# Patient Record
Sex: Female | Born: 1945 | Marital: Married | Smoking: Never smoker
Health system: Southern US, Community
[De-identification: ages and names within clinical notes are randomized; demographics above are authoritative.]

## PROBLEM LIST (undated history)

## (undated) DIAGNOSIS — R011 Cardiac murmur, unspecified: Secondary | ICD-10-CM

## (undated) DIAGNOSIS — E782 Mixed hyperlipidemia: Secondary | ICD-10-CM

## (undated) DIAGNOSIS — M1990 Unspecified osteoarthritis, unspecified site: Secondary | ICD-10-CM

## (undated) DIAGNOSIS — F32A Depression, unspecified: Secondary | ICD-10-CM

## (undated) HISTORY — PX: TONSILLECTOMY: SUR1361

## (undated) HISTORY — PX: COLONOSCOPY: SHX174

## (undated) HISTORY — PX: CATARACT EXTRACTION: SUR2

---

## 2018-08-21 ENCOUNTER — Other Ambulatory Visit: Payer: Self-pay | Admitting: Neurology

## 2018-08-21 DIAGNOSIS — R413 Other amnesia: Secondary | ICD-10-CM

## 2018-08-30 ENCOUNTER — Ambulatory Visit
Admission: RE | Admit: 2018-08-30 | Discharge: 2018-08-30 | Disposition: A | Discharge status: Home / Self Care | Payer: Medicare Other | Source: Ambulatory Visit | Attending: Neurology | Admitting: Neurology

## 2018-08-30 ENCOUNTER — Other Ambulatory Visit: Payer: Self-pay

## 2018-08-30 DIAGNOSIS — R413 Other amnesia: Secondary | ICD-10-CM

## 2020-05-19 ENCOUNTER — Other Ambulatory Visit: Payer: Self-pay | Admitting: Internal Medicine

## 2020-05-19 DIAGNOSIS — Z1231 Encounter for screening mammogram for malignant neoplasm of breast: Secondary | ICD-10-CM

## 2020-05-24 NOTE — Progress Notes (Incomplete)
[facility]  [address] [place], [place] [address] Phone: [phone]  Fax: [phone]   Clinic Day:  05/24/2020  Referring physician: Adin Hector, MD  Chief Complaint: Norma Lambert is a 75 y.o. 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 Ramonita Lab for assessment and management.   HPI: ***  CBC followed: 05/14/2018: Hematocrit 39.3, hemoglobin 12.7, platelets 218,000, WBC 7,500 (ANC 2580; ALC 3,910). 05/05/2019: Hematocrit 41.7, hemoglobin 13.7, platelets 216,000, WBC 8,100 ([place] 2630; ALC 4,490). 12/07/2019: Hematocrit 39.7, hemoglobin 13.0, platelets 213,000, WBC 8,400 (ANC 2510; ALC 4,600). 05/12/2020: Hematocrit 40.4, hemoglobin 13.3, platelets 244,000, WBC 8,300 ([place] 3120; ALC 4,150).  Flow cytometry on 05/12/2020 revealed involvement by a CD5 (very dim), CD10- clonal B-cell population, with non specific phenotype, representing <5,000/ul.  Cells were CD103-, CD23-, FMC7+ with kappa light chain restriction.  A typical phenotype for CLL/SLL, mantle cell lymphoma, hairy cell leukemia or follicular center cell lymphoma was not detected.  Symptomatically, ***  No past medical history on file.  *** The histories are not reviewed yet. Please review them in the "History" navigator section and refresh this [place].  No family history on file.  Social History:  has no history on file for tobacco use, alcohol use, and drug use.The patient is ***alone/accompanied by *** today.  Allergies: Not on File  Current Medications: No current outpatient medications on file.   No current facility-administered medications for this visit.    Review of Systems  Constitutional: Negative for chills, diaphoresis, fever, malaise/fatigue and weight loss.  HENT: Negative for congestion, ear discharge, ear pain, hearing loss, nosebleeds, sinus pain, sore throat and tinnitus.   Eyes: Negative for blurred vision.  Respiratory:  Negative for cough, hemoptysis, sputum production and shortness of breath.   Cardiovascular: Negative for chest pain, palpitations and leg swelling.  Gastrointestinal: Negative for abdominal pain, blood in stool, constipation, diarrhea, heartburn, melena, nausea and vomiting.  Genitourinary: Negative for dysuria, frequency, hematuria and urgency.  Musculoskeletal: Negative for back pain, joint pain, myalgias and neck pain.  Skin: Negative for itching and rash.  Neurological: Negative for dizziness, tingling, sensory change, weakness and headaches.  Endo/Heme/Allergies: Does not bruise/bleed easily.  Psychiatric/Behavioral: Negative for depression and memory loss. The patient is not nervous/anxious and does not have insomnia.   All other systems reviewed and are negative.    Performance status (ECOG): ***  Vitals There were no vitals taken for this visit.   Physical Exam Vitals and nursing note reviewed.  Constitutional:      General: She is not in acute distress.    Appearance: She is not diaphoretic.  HENT:     Head: Normocephalic and atraumatic.     Mouth/Throat:     Mouth: Mucous membranes are moist.     Pharynx: Oropharynx is clear.  Eyes:     General: No scleral icterus.    Extraocular Movements: Extraocular movements intact.     Conjunctiva/sclera: Conjunctivae normal.     Pupils: Pupils are equal, round, and reactive to light.  Cardiovascular:     Rate and Rhythm: Normal rate and regular rhythm.     Heart sounds: Normal heart sounds. No murmur heard.   Pulmonary:     Effort: Pulmonary effort is normal. No respiratory distress.     Breath sounds: Normal breath sounds. No wheezing or rales.  Chest:     Chest wall: No tenderness.  Breasts:     Right: No axillary adenopathy or supraclavicular  adenopathy.     Left: No axillary adenopathy or supraclavicular adenopathy.    Abdominal:     General: Bowel sounds are normal. There is no distension.     Palpations:  Abdomen is soft. There is no mass.     Tenderness: There is no abdominal tenderness. There is no guarding or rebound.  Musculoskeletal:        General: No swelling or tenderness. Normal range of motion.     Cervical back: Normal range of motion and neck supple.  Lymphadenopathy:     Head:     Right side of head: No preauricular, posterior auricular or occipital adenopathy.     Left side of head: No preauricular, posterior auricular or occipital adenopathy.     Cervical: No cervical adenopathy.     Upper Body:     Right upper body: No supraclavicular or axillary adenopathy.     Left upper body: No supraclavicular or axillary adenopathy.     Lower Body: No right inguinal adenopathy. No left inguinal adenopathy.  Skin:    General: Skin is warm and dry.  Neurological:     Mental Status: She is alert and oriented to person, place, and time.  Psychiatric:        Behavior: Behavior normal.        Thought Content: Thought content normal.        Judgment: Judgment normal.      No visits with results within 3 Day(s) from this visit.  Latest known visit with results is:  No results found for any previous visit.    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  Flow cytometry on 05/12/2020 revealed involvement by a CD5 (very dim), CD10- clonal B-cell population, with non specific phenotype, representing <5,000/ul.  Cells were CD103-, CD23-, FMC7+ with kappa light chain restriction.  A typical phenotype for CLL/SLL, mantle cell lymphoma, hairy cell leukemia or follicular center cell lymphoma was not detected.  CBC on 05/12/2020 revealed a hematocrit of 40.4, hemoglobin 13.3, platelets 244,000, WBC 8,300 ([place] 3120; ALC 4,150).  Symptomatically, ***  Plan: 1.   Labs today: 2.   Peripheral smear for path review. 3.   Lymphocytosis    I discussed the assessment and treatment plan with the patient.  The patient was provided an opportunity to  ask questions and all were answered.  The patient agreed with the plan and demonstrated an understanding of the instructions.  The patient was advised to call back if the symptoms worsen or if the condition fails to improve as anticipated.  I provided *** minutes of face-to-face time during this this encounter and > 50% was spent counseling as documented under my assessment and plan.   Melissa C. Mike Gip, MD, PhD    05/24/2020, 10:02 AM  I, Mirian Mo Tufford, am acting as Education administrator for [employer]. Mike Gip, MD, PhD.  {Add scribe attestation statement}

## 2020-05-25 ENCOUNTER — Inpatient Hospital Stay: Payer: Medicare PPO | Attending: Hematology and Oncology | Admitting: Hematology and Oncology

## 2020-05-25 ENCOUNTER — Encounter: Payer: Self-pay | Admitting: Hematology and Oncology

## 2020-05-25 ENCOUNTER — Inpatient Hospital Stay: Payer: Medicare PPO

## 2020-05-25 ENCOUNTER — Other Ambulatory Visit: Payer: Self-pay

## 2020-05-25 VITALS — BP 116/70 | HR 59 | Temp 97.1°F | Resp 20 | Ht 62.0 in | Wt 148.8 lb

## 2020-05-25 DIAGNOSIS — Z79899 Other long term (current) drug therapy: Secondary | ICD-10-CM | POA: Diagnosis not present

## 2020-05-25 DIAGNOSIS — D72820 Lymphocytosis (symptomatic): Secondary | ICD-10-CM

## 2020-05-25 DIAGNOSIS — Z800 Family history of malignant neoplasm of digestive organs: Secondary | ICD-10-CM | POA: Diagnosis not present

## 2020-05-25 LAB — CBC WITH DIFFERENTIAL/PLATELET
Abs Immature Granulocytes: 0.04 10*3/uL (ref 0.00–0.07)
Basophils Absolute: 0.1 10*3/uL (ref 0.0–0.1)
Basophils Relative: 1 %
Eosinophils Absolute: 0.1 10*3/uL (ref 0.0–0.5)
Eosinophils Relative: 1 %
HCT: 39.8 % (ref 36.0–46.0)
Hemoglobin: 13.1 g/dL (ref 12.0–15.0)
Immature Granulocytes: 1 %
Lymphocytes Relative: 34 %
Lymphs Abs: 3 10*3/uL (ref 0.7–4.0)
MCH: 30.2 pg (ref 26.0–34.0)
MCHC: 32.9 g/dL (ref 30.0–36.0)
MCV: 91.7 fL (ref 80.0–100.0)
Monocytes Absolute: 0.5 10*3/uL (ref 0.1–1.0)
Monocytes Relative: 6 %
Neutro Abs: 5 10*3/uL (ref 1.7–7.7)
Neutrophils Relative %: 57 %
Platelets: 241 10*3/uL (ref 150–400)
RBC: 4.34 MIL/uL (ref 3.87–5.11)
RDW: 12.5 % (ref 11.5–15.5)
WBC: 8.8 10*3/uL (ref 4.0–10.5)
nRBC: 0 % (ref 0.0–0.2)

## 2020-05-25 LAB — PATHOLOGIST SMEAR REVIEW

## 2020-05-25 LAB — URIC ACID: Uric Acid, Serum: 5 mg/dL (ref 2.5–7.1)

## 2020-05-25 LAB — LACTATE DEHYDROGENASE: LDH: 130 U/L (ref 98–192)

## 2020-05-25 NOTE — Progress Notes (Signed)
Patient here today for initial evaluation regarding lymphcytosis, referred by Dr. Caryl Comes.

## 2020-05-28 LAB — MULTIPLE MYELOMA PANEL, SERUM
Albumin SerPl Elph-Mcnc: 4.3 g/dL (ref 2.9–4.4)
Albumin/Glob SerPl: 1.6 (ref 0.7–1.7)
Alpha 1: 0.2 g/dL (ref 0.0–0.4)
Alpha2 Glob SerPl Elph-Mcnc: 0.7 g/dL (ref 0.4–1.0)
B-Globulin SerPl Elph-Mcnc: 0.9 g/dL (ref 0.7–1.3)
Gamma Glob SerPl Elph-Mcnc: 0.8 g/dL (ref 0.4–1.8)
Globulin, Total: 2.7 g/dL (ref 2.2–3.9)
IgA: 188 mg/dL (ref 64–422)
IgG (Immunoglobin G), Serum: 1000 mg/dL (ref 586–1602)
IgM (Immunoglobulin M), Srm: 55 mg/dL (ref 26–217)
Total Protein ELP: 7 g/dL (ref 6.0–8.5)

## 2020-05-30 ENCOUNTER — Ambulatory Visit
Admission: RE | Admit: 2020-05-30 | Discharge: 2020-05-30 | Disposition: A | Discharge status: Home / Self Care | Payer: Medicare PPO | Source: Ambulatory Visit | Attending: Internal Medicine | Admitting: Internal Medicine

## 2020-05-30 ENCOUNTER — Other Ambulatory Visit: Payer: Self-pay

## 2020-05-30 DIAGNOSIS — Z1231 Encounter for screening mammogram for malignant neoplasm of breast: Secondary | ICD-10-CM | POA: Diagnosis not present

## 2020-05-31 ENCOUNTER — Inpatient Hospital Stay
Admission: RE | Admit: 2020-05-31 | Discharge: 2020-05-31 | Disposition: A | Discharge status: Home / Self Care | Payer: Self-pay | Source: Ambulatory Visit | Attending: *Deleted | Admitting: *Deleted

## 2020-05-31 ENCOUNTER — Other Ambulatory Visit: Payer: Self-pay | Admitting: *Deleted

## 2020-05-31 DIAGNOSIS — Z1231 Encounter for screening mammogram for malignant neoplasm of breast: Secondary | ICD-10-CM

## 2020-06-09 ENCOUNTER — Telehealth: Payer: Self-pay | Admitting: *Deleted

## 2020-06-09 NOTE — Telephone Encounter (Signed)
Attempted to reach patient prior to her virtual visit tomorrow with Dr. Mike Gip. Left vm for patient to return my phone call.

## 2020-06-09 NOTE — Progress Notes (Signed)
This encounter was created in error - please disregard.

## 2020-06-10 ENCOUNTER — Inpatient Hospital Stay: Payer: Medicare PPO | Attending: Hematology and Oncology | Admitting: Hematology and Oncology

## 2020-06-10 ENCOUNTER — Telehealth: Payer: Self-pay | Admitting: *Deleted

## 2020-06-10 ENCOUNTER — Encounter: Payer: Self-pay | Admitting: *Deleted

## 2020-06-10 DIAGNOSIS — D72820 Lymphocytosis (symptomatic): Secondary | ICD-10-CM

## 2020-06-10 NOTE — Telephone Encounter (Signed)
Left vm for pt to return my phone call to get her ready for a virtual visit with Dr. Mike Gip today.

## 2020-06-19 NOTE — Progress Notes (Signed)
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 Ramonita Lab for assessment and management.   Referring physician: Adin Hector, MD  HPI: The patient became aware of her lymphocytosis 2 weeks ago at a routine check-up. She was not having any symptoms at this time.  CBC followed: 05/14/2018: Hematocrit 39.3, hemoglobin 12.7, platelets 218,000, WBC 7,500 (ANC 2580; ALC 3,910). 05/05/2019: Hematocrit 41.7, hemoglobin 13.7, platelets 216,000, WBC 8,100 ([place] 2630; ALC 4,490). 12/07/2019: Hematocrit 39.7, hemoglobin 13.0, platelets 213,000, WBC 8,400 (ANC 2510; ALC 4,600). 05/12/2020: Hematocrit 40.4, hemoglobin 13.3, platelets 244,000, WBC 8,300 ([place] 3120; ALC 4,150).  Flow cytometry on 05/12/2020 revealed involvement by a CD5 (very dim), CD10- clonal B-cell population, with non specific phenotype, representing <5,000/ul.  Cells were CD103-, CD23-, FMC7+ with kappa light chain restriction.  A typical phenotype for CLL/SLL, mantle cell lymphoma, hairy cell leukemia or follicular center cell lymphoma was not detected.  Symptomatically, she has felt good. She has occasional reflux. She has left shoulder pain and got an injection 2 days ago. The patient denies fevers, sweats, weight loss, headaches, changes in vision, runny nose, sore throat, cough, shortness of breath, chest pain, palpitations, nausea, vomiting, diarrhea, early satiety, skin changes, numbness, weakness, balance or coordination problems, lumps or bumps, and bleeding of any kind.  The patient has UTIs 2-3x per year. She does not have urinary symptoms right now. She denies any other infections. The patient has always been a light eater. She stays active by walking 10,000 steps per day.  Her mother died from  colon cancer. Her brother has amyloidosis. Her paternal uncle died of cancer 74 years ago.  History reviewed. No pertinent past medical history.  History reviewed. No pertinent surgical history.  Family History  Problem Relation Age of Onset  . Breast cancer Neg Hx     Social History:  reports current alcohol use. She reports that she does not use drugs. No history on file for tobacco use.  She has a glass of wine at night. She denies tobacco use. She denies exposure to radiation or toxins. She is a retired Pharmacist, hospital. She is originally from [place] and still has a house and family there. She visits [place] in the summers. Her husband is Clair Gulling. The patient is accompanied by Clair Gulling today.  Allergies: No Known Allergies  Current Medications: Current Outpatient Medications  Medication Sig Dispense Refill  . acetaminophen (TYLENOL) 500 MG tablet Take by mouth.    Marland Kitchen atorvastatin (LIPITOR) 10 MG tablet TAKE 1 TABLET(10 MG) BY MOUTH EVERY DAY    . calcium elemental as carbonate (BARIATRIC TUMS ULTRA) 400 MG chewable tablet Chew by mouth.    . citalopram (CELEXA) 20 MG tablet TAKE 1 TABLET(20 MG) BY MOUTH EVERY DAY    . cyanocobalamin 1000 MCG tablet Take by mouth.    . diclofenac Sodium (VOLTAREN) 1 % GEL Apply topically.    . magnesium oxide (MAG-OX) 400 MG tablet Take by mouth.    . naproxen sodium (ALEVE) 220 MG tablet Take by mouth.     No current facility-administered medications for this visit.    Review of Systems:  Constitutional: Negative for chills, diaphoresis, fever, malaise/fatigue and weight loss.  HENT: Negative for congestion, ear discharge, ear pain, hearing loss, nosebleeds, sinus pain, sore throat  and tinnitus.   Eyes: Negative for blurred vision.  Respiratory: Negative for cough, hemoptysis, sputum production and shortness of breath.   Cardiovascular: Negative for chest pain, palpitations and leg swelling.  Gastrointestinal: Positive for heartburn. Negative for abdominal pain,  blood in stool, constipation, diarrhea, melena, nausea and vomiting.  Genitourinary: Negative for dysuria, frequency, hematuria and urgency. History of 2-3 UTIs per year. Musculoskeletal: Positive for joint pain (left shoulder). Negative for back pain, myalgias and neck pain.  Skin: Negative for itching and rash.  Neurological: Negative for dizziness, tingling, sensory change, weakness and headaches.  Endo/Heme/Allergies: Does not bruise/bleed easily.  Psychiatric/Behavioral: Negative for depression and memory loss. The patient is not nervous/anxious and does not have insomnia.   All other systems reviewed and are negative.  Performance status:  0  Physical Exam: Blood pressure 116/70, pulse (!) 59, temperature (!) 97.1 F (36.2 C), temperature source Tympanic, resp. rate 20, height 5\' 2"  (1.575 m), weight 148 lb 13 oz (67.5 kg). GENERAL:  Well developed, well nourished, woman sitting comfortably in the exam room in no acute distress. MENTAL STATUS:  Alert and oriented to person, place and time. HEAD:  Styled gray hair.  Normocephalic, atraumatic, face symmetric, no Cushingoid features. EYES:  Brown eyes.  Pupils equal round and reactive to light and accomodation.  No conjunctivitis or scleral icterus. ENT:  Oropharynx clear without lesion.  Tongue normal. Mucous membranes moist.  RESPIRATORY:  Clear to auscultation without rales, wheezes or rhonchi. CARDIOVASCULAR:  Regular rate and rhythm without murmur, rub or gallop. ABDOMEN:  Soft, slight LLQ tenderness without guarding or rebound tenderness.  Active bowel sounds and no hepatosplenomegaly.  No masses. SKIN:  No rashes, ulcers or lesions. EXTREMITIES: No edema, no skin discoloration or tenderness.  No palpable cords. LYMPH NODES: No palpable cervical, supraclavicular, axillary or inguinal adenopathy  NEUROLOGICAL: Unremarkable. PSYCH:  Appropriate.   Appointment on 05/25/2020  Component Date Value Ref Range Status  . IgG  (Immunoglobin G), Serum 05/25/2020 1,000  586 - 1,602 mg/dL Final  . IgA 05/25/2020 188  64 - 422 mg/dL Final  . IgM (Immunoglobulin M), Srm 05/25/2020 55  26 - 217 mg/dL Final  . Total Protein ELP 05/25/2020 7.0  6.0 - 8.5 g/dL Corrected  . Albumin SerPl Elph-Mcnc 05/25/2020 4.3  2.9 - 4.4 g/dL Corrected  . Alpha 1 05/25/2020 0.2  0.0 - 0.4 g/dL Corrected  . Alpha2 Glob SerPl Elph-Mcnc 05/25/2020 0.7  0.4 - 1.0 g/dL Corrected  . B-Globulin SerPl Elph-Mcnc 05/25/2020 0.9  0.7 - 1.3 g/dL Corrected  . Gamma Glob SerPl Elph-Mcnc 05/25/2020 0.8  0.4 - 1.8 g/dL Corrected  . M Protein SerPl Elph-Mcnc 05/25/2020 Not Observed  Not Observed g/dL Corrected  . Globulin, Total 05/25/2020 2.7  2.2 - 3.9 g/dL Corrected  . Albumin/Glob SerPl 05/25/2020 1.6  0.7 - 1.7 Corrected  . IFE 1 05/25/2020 Comment   Corrected   Comment: (NOTE) The immunofixation pattern appears unremarkable. Evidence of monoclonal protein is not apparent.   . Please Note 05/25/2020 Comment   Corrected   Comment: (NOTE) Protein electrophoresis scan will follow via computer, mail, or courier delivery. Performed At: [facility] [place], [place] [id number] Rush Farmer MD DP:[id number]   . LDH 05/25/2020 130  98 - 192 U/L Final   Performed at [facility], [address], [place], [place] [address]  . WBC 05/25/2020 8.8  4.0 - 10.5 K/uL Final  . RBC 05/25/2020 4.34  3.87 -  5.11 MIL/uL Final  . Hemoglobin 05/25/2020 13.1  12.0 - 15.0 g/dL Final  . HCT 05/25/2020 39.8  36.0 - 46.0 % Final  . MCV 05/25/2020 91.7  80.0 - 100.0 fL Final  . MCH 05/25/2020 30.2  26.0 - 34.0 pg Final  . MCHC 05/25/2020 32.9  30.0 - 36.0 g/dL Final  . RDW 05/25/2020 12.5  11.5 - 15.5 % Final  . Platelets 05/25/2020 241  150 - 400 K/uL Final  . nRBC 05/25/2020 0.0  0.0 - 0.2 % Final  . Neutrophils Relative % 05/25/2020 57  % Final  . Neutro Abs 05/25/2020 5.0  1.7 - 7.7 K/uL Final  . Lymphocytes Relative  05/25/2020 34  % Final  . Lymphs Abs 05/25/2020 3.0  0.7 - 4.0 K/uL Final  . Monocytes Relative 05/25/2020 6  % Final  . Monocytes Absolute 05/25/2020 0.5  0.1 - 1.0 K/uL Final  . Eosinophils Relative 05/25/2020 1  % Final  . Eosinophils Absolute 05/25/2020 0.1  0.0 - 0.5 K/uL Final  . Basophils Relative 05/25/2020 1  % Final  . Basophils Absolute 05/25/2020 0.1  0.0 - 0.1 K/uL Final  . Immature Granulocytes 05/25/2020 1  % Final  . Abs Immature Granulocytes 05/25/2020 0.04  0.00 - 0.07 K/uL Final   Performed at [facility], [address], [place], [place] [address]  . Uric Acid, Serum 05/25/2020 5.0  2.5 - 7.1 mg/dL Final   Performed at [facility], [address], [place], [place] [address]  . Path Review 05/25/2020 Blood smear is reviewed.   Final   Comment: Patient undergoing hematology evaluation for lymphocytosis. Recent peripheral blood flow cytometry revealed presence of a very dim CD5 and CD10 negative clonal B cell population with kappa light chain restriction.  The population had a non specific phenotype and represented less than 5000/ul. Subset of lymphocytes with mild increase in cytoplasm. Unremarkable RBC, WBC, and platelet morphology. Additional laboratory studies are pending as per hematologist. Reviewed by Dellia Nims. Reuel Derby, M.D. Performed at [facility], [address], [place],  [address]     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  Flow cytometry on 05/12/2020 revealed involvement by a CD5 (very dim), CD10- clonal B-cell population, with non specific phenotype, representing <5,000/ul.  Cells were CD103-, CD23-, FMC7+ with kappa light chain restriction.  A typical phenotype for CLL/SLL, mantle cell lymphoma, hairy cell leukemia or follicular center cell lymphoma was not detected.  CBC on 05/12/2020 revealed a hematocrit of 40.4, hemoglobin 13.3, platelets 244,000, WBC  8,300 ([place] 3120; ALC 4,150).  Symptomatically, she feels good.  She denies any B symptoms.  Exam reveals no adenopathy or hepatosplenomegaly.  Plan: 1.   Labs today:  CBC with diff, myeloma panel, LDH, uric acid. 2.   Peripheral smear for path review. 3.   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    Diagnosis and management of each discussed.   Discuss risk of transformation of monoclonal B cell lymphocytosis to CLL.   Discuss indications for treatment of CLL.  Discuss additional labs today. 4.   RN:  Assist patient in setting up [place]. 5.   RTC for MD assessment (telephone) and review of results. 6.   RTC in 1 year for MD assessment and labs (CBC with diff, BMP, LDH).  I discussed the assessment and treatment plan with the patient.  The patient was provided an opportunity to ask questions and all were answered.  The patient agreed with the plan and demonstrated an understanding of the instructions.  The patient was advised to call back if the symptoms worsen or if the condition fails to improve as anticipated.  I provided 26 minutes of face-to-face time during this this encounter and > 50% was spent counseling as documented under my assessment and plan. An additional 10 minutes were spent reviewing her chart (Epic and Care Everywhere) including notes, labs, and imaging studies.    Donovyn Guidice C. Mike Gip, MD, PhD    05/25/2020, 11:01 AM  I, Mirian Mo Tufford, am acting as Education administrator for [employer]. Mike Gip, MD, PhD.  I, Jermine Bibbee C. Mike Gip, MD, have reviewed the above documentation for accuracy and completeness, and I agree with the above.

## 2020-07-28 ENCOUNTER — Encounter: Payer: Self-pay | Admitting: Hematology and Oncology

## 2020-08-11 ENCOUNTER — Inpatient Hospital Stay: Payer: Medicare PPO | Attending: Oncology | Admitting: Oncology

## 2020-08-11 ENCOUNTER — Telehealth: Payer: Self-pay

## 2020-08-11 ENCOUNTER — Other Ambulatory Visit: Payer: Self-pay

## 2020-08-11 NOTE — Telephone Encounter (Signed)
Tried contacting pt in regards to virtual appt with Dr. Tasia Catchings. The only phone number on file is not in service.

## 2021-04-26 ENCOUNTER — Encounter: Payer: Self-pay | Admitting: Gastroenterology

## 2021-04-26 NOTE — H&P (Signed)
Pre-Procedure H&P   Patient ID: Norma Lambert is a 76 y.o. female.  Gastroenterology Provider: Annamaria Helling, DO  Referring Provider: Laurine Blazer, PA PCP: Adin Hector, MD  Date: 04/27/2021  HPI Ms. Norma Lambert is a 76 y.o. female who presents today for Colonoscopy for Surveillance-family history of colon cancer-mother 70s.  Patient with regular bowel movements without hematochezia melena diarrhea or constipation.  She underwent a colonoscopy at [facility] approximately 9 years ago.  These records are not available to [place] at this time.  Unknown findings, but patient denies polyps.  She has monoclonal B-cell CLL per records. Most recent labs WBC 7.4 hemoglobin 13.2 MCV 93.5 platelets 212,000. No other acute GI complaints   Past Medical History:  Diagnosis Date   Arthritis    Depression    Heart murmur    Mixed hyperlipidemia     Past Surgical History:  Procedure Laterality Date   CATARACT EXTRACTION Left    COLONOSCOPY     multiple d/t family hx colon cancer   TONSILLECTOMY      Family History colon cancer-mother 70s No other h/o GI disease or malignancy  Review of Systems  Constitutional:  Negative for activity change, appetite change, chills, diaphoresis, fatigue, fever and unexpected weight change.  HENT:  Negative for trouble swallowing and voice change.   Respiratory:  Negative for shortness of breath and wheezing.   Cardiovascular:  Negative for chest pain, palpitations and leg swelling.  Gastrointestinal:  Negative for abdominal distention, abdominal pain, anal bleeding, blood in stool, constipation, diarrhea, nausea, rectal pain and vomiting.  Musculoskeletal:  Negative for arthralgias and myalgias.  Skin:  Negative for color change and pallor.  Neurological:  Negative for dizziness, syncope and weakness.  Psychiatric/Behavioral:  Negative for confusion.   All other systems reviewed and are negative.   Medications No current  facility-administered medications on file prior to encounter.   Current Outpatient Medications on File Prior to Encounter  Medication Sig Dispense Refill   acetaminophen (TYLENOL) 500 MG tablet Take by mouth.     atorvastatin (LIPITOR) 10 MG tablet TAKE 1 TABLET(10 MG) BY MOUTH EVERY DAY     calcium elemental as carbonate (BARIATRIC TUMS ULTRA) 400 MG chewable tablet Chew by mouth.     citalopram (CELEXA) 20 MG tablet TAKE 1 TABLET(20 MG) BY MOUTH EVERY DAY     cyanocobalamin 1000 MCG tablet Take by mouth.     diclofenac Sodium (VOLTAREN) 1 % GEL Apply topically.     magnesium oxide (MAG-OX) 400 MG tablet Take by mouth.     memantine (NAMENDA) 5 MG tablet Take 5 mg by mouth 2 (two) times daily.     naproxen sodium (ALEVE) 220 MG tablet Take by mouth.      Pertinent medications related to GI and procedure were reviewed by me with the patient prior to the procedure   Current Facility-Administered Medications:    0.9 %  sodium chloride infusion, , Intravenous, Continuous, Annamaria Helling, DO, Last Rate: 20 mL/hr at 04/27/21 0917, New Bag at 04/27/21 0917      No Known Allergies Allergies were reviewed by me prior to the procedure  Objective    Vitals:   04/27/21 0914  BP: 123/80  Pulse: 70  Resp: 18  Temp: (!) 96.3 F (35.7 C)  TempSrc: Temporal  SpO2: 97%  Weight: 66.7 kg  Height: 5\' 4"  (1.626 m)     Physical Exam Vitals and nursing note reviewed.  Constitutional:      General: She is not in acute distress.    Appearance: Normal appearance. She is not ill-appearing, toxic-appearing or diaphoretic.  HENT:     Head: Normocephalic and atraumatic.     Nose: Nose normal.     Mouth/Throat:     Mouth: Mucous membranes are moist.     Pharynx: Oropharynx is clear.  Eyes:     General: No scleral icterus.    Extraocular Movements: Extraocular movements intact.  Cardiovascular:     Rate and Rhythm: Normal rate and regular rhythm.     Heart sounds: Normal heart  sounds. No murmur heard.   No friction rub. No gallop.  Pulmonary:     Effort: Pulmonary effort is normal. No respiratory distress.     Breath sounds: Normal breath sounds. No wheezing, rhonchi or rales.  Abdominal:     General: Bowel sounds are normal. There is no distension.     Palpations: Abdomen is soft.     Tenderness: There is no abdominal tenderness. There is no guarding or rebound.  Musculoskeletal:     Cervical back: Neck supple.     Right lower leg: No edema.     Left lower leg: No edema.  Skin:    General: Skin is warm and dry.     Coloration: Skin is not jaundiced or pale.  Neurological:     Mental Status: She is alert. Mental status is at baseline.     Assessment:  Ms. Norma Lambert is a 76 y.o. female  who presents today for Colonoscopy for Surveillance-family history of colon cancer-mother 6s.  Plan:  Colonoscopy with possible intervention today  Colonoscopy with possible biopsy, control of bleeding, polypectomy, and interventions as necessary has been discussed with the patient/patient representative. Informed consent was obtained from the patient/patient representative after explaining the indication, nature, and risks of the procedure including but not limited to death, bleeding, perforation, missed neoplasm/lesions, cardiorespiratory compromise, and reaction to medications. Opportunity for questions was given and appropriate answers were provided. Patient/patient representative has verbalized understanding is amenable to undergoing the procedure.   Annamaria Helling, DO  [facility] Gastroenterology  Portions of the record may have been created with voice recognition software. Occasional wrong-word or 'sound-a-like' substitutions may have occurred due to the inherent limitations of voice recognition software.  Read the chart carefully and recognize, using context, where substitutions may have occurred.

## 2021-04-27 ENCOUNTER — Encounter: Payer: Self-pay | Admitting: Gastroenterology

## 2021-04-27 ENCOUNTER — Ambulatory Visit: Payer: Medicare PPO | Admitting: Certified Registered"

## 2021-04-27 ENCOUNTER — Ambulatory Visit
Admission: RE | Admit: 2021-04-27 | Discharge: 2021-04-27 | Disposition: A | Discharge status: Home / Self Care | Payer: Medicare PPO | Attending: Gastroenterology | Admitting: Gastroenterology

## 2021-04-27 ENCOUNTER — Other Ambulatory Visit: Payer: Self-pay

## 2021-04-27 ENCOUNTER — Encounter
Admission: RE | Disposition: A | Discharge status: Home / Self Care | Payer: Self-pay | Source: Home / Self Care | Attending: Gastroenterology

## 2021-04-27 DIAGNOSIS — K621 Rectal polyp: Secondary | ICD-10-CM | POA: Diagnosis not present

## 2021-04-27 DIAGNOSIS — K5730 Diverticulosis of large intestine without perforation or abscess without bleeding: Secondary | ICD-10-CM | POA: Insufficient documentation

## 2021-04-27 DIAGNOSIS — E782 Mixed hyperlipidemia: Secondary | ICD-10-CM | POA: Insufficient documentation

## 2021-04-27 DIAGNOSIS — K640 First degree hemorrhoids: Secondary | ICD-10-CM | POA: Insufficient documentation

## 2021-04-27 DIAGNOSIS — D120 Benign neoplasm of cecum: Secondary | ICD-10-CM | POA: Insufficient documentation

## 2021-04-27 DIAGNOSIS — F32A Depression, unspecified: Secondary | ICD-10-CM | POA: Diagnosis not present

## 2021-04-27 DIAGNOSIS — M1990 Unspecified osteoarthritis, unspecified site: Secondary | ICD-10-CM | POA: Insufficient documentation

## 2021-04-27 DIAGNOSIS — Z800 Family history of malignant neoplasm of digestive organs: Secondary | ICD-10-CM | POA: Diagnosis not present

## 2021-04-27 DIAGNOSIS — Z1211 Encounter for screening for malignant neoplasm of colon: Secondary | ICD-10-CM | POA: Insufficient documentation

## 2021-04-27 HISTORY — DX: Depression, unspecified: F32.A

## 2021-04-27 HISTORY — DX: Unspecified osteoarthritis, unspecified site: M19.90

## 2021-04-27 HISTORY — DX: Cardiac murmur, unspecified: R01.1

## 2021-04-27 HISTORY — PX: COLONOSCOPY WITH PROPOFOL: SHX5780

## 2021-04-27 HISTORY — DX: Mixed hyperlipidemia: E78.2

## 2021-04-27 SURGERY — COLONOSCOPY WITH PROPOFOL
Anesthesia: General

## 2021-04-27 MED ORDER — PROPOFOL 10 MG/ML IV BOLUS
INTRAVENOUS | Status: DC | PRN
  Administered 2021-04-27 (×3): 10 mg via INTRAVENOUS
  Administered 2021-04-27 (×2): 20 mg via INTRAVENOUS
  Administered 2021-04-27 (×3): 10 mg via INTRAVENOUS
  Administered 2021-04-27: 80 mg via INTRAVENOUS
  Administered 2021-04-27 (×2): 10 mg via INTRAVENOUS
  Administered 2021-04-27: 20 mg via INTRAVENOUS
  Administered 2021-04-27 (×3): 10 mg via INTRAVENOUS

## 2021-04-27 MED ORDER — GLYCOPYRROLATE 0.2 MG/ML IJ SOLN
INTRAMUSCULAR | Status: DC | PRN
Start: 2021-04-27 — End: 2021-04-27
  Administered 2021-04-27: .2 mg via INTRAVENOUS

## 2021-04-27 MED ORDER — SODIUM CHLORIDE 0.9 % IV SOLN: INTRAVENOUS | Status: DC

## 2021-04-27 MED ORDER — PROPOFOL 10 MG/ML IV BOLUS
INTRAVENOUS | Status: AC
  Filled 2021-04-27: qty 20

## 2021-04-27 MED ORDER — LIDOCAINE HCL (PF) 2 % IJ SOLN
INTRAMUSCULAR | Status: AC
  Filled 2021-04-27: qty 5

## 2021-04-27 MED ORDER — LIDOCAINE HCL (CARDIAC) PF 100 MG/5ML IV SOSY
PREFILLED_SYRINGE | INTRAVENOUS | Status: DC | PRN
Start: 2021-04-27 — End: 2021-04-27
  Administered 2021-04-27: 100 mg via INTRAVENOUS

## 2021-04-27 MED ORDER — GLYCOPYRROLATE 0.2 MG/ML IJ SOLN
INTRAMUSCULAR | Status: AC
  Filled 2021-04-27: qty 1

## 2021-04-27 NOTE — Anesthesia Postprocedure Evaluation (Signed)
Anesthesia Post Note  Patient: Norma Lambert  Procedure(s) Performed: COLONOSCOPY WITH PROPOFOL  Patient location during evaluation: Endoscopy Anesthesia Type: General Level of consciousness: awake and alert Pain management: pain level controlled Vital Signs Assessment: post-procedure vital signs reviewed and stable Respiratory status: spontaneous breathing, nonlabored ventilation, respiratory function stable and patient connected to nasal cannula oxygen Cardiovascular status: blood pressure returned to baseline and stable Postop Assessment: no apparent nausea or vomiting Anesthetic complications: no   No notable events documented.   Last Vitals:  Vitals:   04/27/21 1104 04/27/21 1113  BP: (!) 122/109 134/73  Pulse: 69 67  Resp: 11 15  Temp:    SpO2: 98% 99%    Last Pain:  Vitals:   04/27/21 1113  TempSrc:   PainSc: 0-No pain                 Precious Haws Cro[place] Presley

## 2021-04-27 NOTE — Op Note (Signed)
[facility] Gastroenterology Patient Name: Norma Lambert Procedure Date: 04/27/2021 9:46 AM MRN: [id number] Account #: [id number] Date of Birth: 09-16-1945 Admit Type: Outpatient Age: 76 Room: [facility] ENDO ROOM 1 Gender: Female Note Status: Finalized Instrument Name: Jasper Riling [id number] Procedure:             Colonoscopy Indications:           Screening patient at increased risk: Family history of                         1st-degree relative with colorectal cancer at age 55                         years (or older) Providers:             Rueben Bash, DO Referring MD:          Ramonita Lab, MD (Referring MD) Medicines:             Monitored Anesthesia Care Complications:         No immediate complications. Estimated blood loss:                         Minimal. Procedure:             Pre-Anesthesia Assessment:                        - Prior to the procedure, a History and Physical was                         performed, and patient medications and allergies were                         reviewed. The patient is competent. The risks and                         benefits of the procedure and the sedation options and                         risks were discussed with the patient. All questions                         were answered and informed consent was obtained.                         Patient identification and proposed procedure were                         verified by the physician, the nurse, the anesthetist                         and the technician in the endoscopy suite. Mental                         Status Examination: alert and oriented. Airway                         Examination: normal oropharyngeal airway and neck  mobility. Respiratory Examination: clear to                         auscultation. CV Examination: RRR, no murmurs, no S3                         or S4. Prophylactic Antibiotics: The patient does not                          require prophylactic antibiotics. Prior                         Anticoagulants: The patient has taken no previous                         anticoagulant or antiplatelet agents. ASA Grade                         Assessment: II - A patient with mild systemic disease.                         After reviewing the risks and benefits, the patient                         was deemed in satisfactory condition to undergo the                         procedure. The anesthesia plan was to use monitored                         anesthesia care (MAC). Immediately prior to                         administration of medications, the patient was                         re-assessed for adequacy to receive sedatives. The                         heart rate, respiratory rate, oxygen saturations,                         blood pressure, adequacy of pulmonary ventilation, and                         response to care were monitored throughout the                         procedure. The physical status of the patient was                         re-assessed after the procedure.                        After obtaining informed consent, the colonoscope was                         passed under direct vision. Throughout the procedure,  the patient's blood pressure, pulse, and oxygen                         saturations were monitored continuously. The                         Colonoscope was introduced through the anus and                         advanced to the the cecum, identified by appendiceal                         orifice and ileocecal valve. The colonoscopy was                         performed without difficulty. The patient tolerated                         the procedure well. The quality of the bowel                         preparation was evaluated using the BBPS [facility] Bowel                         Preparation Scale) with scores of: Right Colon = 3,                         Transverse  Colon = 3 and Left Colon = 3 (entire mucosa                         seen well with no residual staining, small fragments                         of stool or opaque liquid). The total BBPS score                         equals 9. The ileocecal valve, appendiceal orifice,                         and rectum were photographed. Findings:      Two sessile polyps were found in the rectum and cecum. The polyps were 1       to 3 mm in size. These polyps were removed with a cold biopsy forceps.       Resection and retrieval were complete. Estimated blood loss was minimal.      Multiple small-mouthed diverticula were found in the left colon.       Estimated blood loss: none.      Non-bleeding internal hemorrhoids were found during retroflexion. The       hemorrhoids were Grade I (internal hemorrhoids that do not prolapse).       Estimated blood loss was minimal.      The exam was otherwise without abnormality on direct and retroflexion       views. Impression:            - Two 1 to 3 mm polyps in the rectum and in the cecum,  removed with a cold biopsy forceps. Resected and                         retrieved.                        - Diverticulosis in the left colon.                        - Non-bleeding internal hemorrhoids.                        - The examination was otherwise normal on direct and                         retroflexion views. Recommendation:        - Discharge patient to home.                        - Resume previous diet.                        - Continue present medications.                        - Await pathology results.                        - Repeat colonoscopy for surveillance based on                         pathology results.                        - Return to referring physician as previously                         scheduled. Procedure Code(s):     --- Professional ---                        [phone], Colonoscopy, flexible; with biopsy, single or                          multiple Diagnosis Code(s):     --- Professional ---                        Z80.0, Family history of malignant neoplasm of                         digestive organs                        K62.1, Rectal polyp                        K63.5, Polyp of colon                        K64.0, First degree hemorrhoids                        K57.30, Diverticulosis of large intestine without  perforation or abscess without bleeding CPT copyright 2019 American Medical Association. All rights reserved. The codes documented in this report are preliminary and upon coder review may  be revised to meet current compliance requirements. Attending Participation:      I personally performed the entire procedure. Volney American, DO Annamaria Helling DO, DO 04/27/2021 10:48:39 AM This report has been signed electronically. Number of Addenda: 0 Note Initiated On: 04/27/2021 9:46 AM Scope Withdrawal Time: 0 hours 14 minutes 34 seconds  Total Procedure Duration: 0 hours 29 minutes 17 seconds  Estimated Blood Loss:  Estimated blood loss was minimal.      [facility]

## 2021-04-27 NOTE — Transfer of Care (Signed)
Immediate Anesthesia Transfer of Care Note  Patient: Norma Lambert  Procedure(s) Performed: COLONOSCOPY WITH PROPOFOL  Patient Location: PACU  Anesthesia Type:General  Level of Consciousness: awake  Airway & Oxygen Therapy: Patient Spontanous Breathing  Post-op Assessment: Report given to RN and Post -op Vital signs reviewed and stable  Post vital signs: Reviewed and stable  Last Vitals:  Vitals Value Taken Time  BP 110/72 04/27/21 1045  Temp 35.9   Pulse 72 04/27/21 1045  Resp 20 04/27/21 1045  SpO2 97 % 04/27/21 1045  Vitals shown include unvalidated device data.  Last Pain:  Vitals:   04/27/21 1044  TempSrc:   PainSc: 0-No pain         Complications: No notable events documented.

## 2021-04-27 NOTE — Anesthesia Preprocedure Evaluation (Signed)
Anesthesia Evaluation  Patient identified by MRN, date of birth, ID band Patient awake    Reviewed: Allergy & Precautions, NPO status , Patient's Chart, lab work & pertinent test results  History of Anesthesia Complications Negative for: history of anesthetic complications  Airway Mallampati: III  TM Distance: >3 FB Neck ROM: full    Dental  (+) Chipped   Pulmonary neg pulmonary ROS, neg shortness of breath,    Pulmonary exam normal        Cardiovascular Exercise Tolerance: Good (-) DOE Normal cardiovascular exam+ Valvular Problems/Murmurs      Neuro/Psych PSYCHIATRIC DISORDERS negative neurological ROS     GI/Hepatic negative GI ROS, Neg liver ROS, neg GERD  ,  Endo/Other  negative endocrine ROS  Renal/GU negative Renal ROS  negative genitourinary   Musculoskeletal  (+) Arthritis ,   Abdominal   Peds  Hematology negative hematology ROS (+)   Anesthesia Other Findings Past Medical History: No date: Arthritis No date: Depression No date: Heart murmur No date: Mixed hyperlipidemia  Past Surgical History: No date: CATARACT EXTRACTION; Left No date: COLONOSCOPY     Comment:  multiple d/t family hx colon cancer No date: TONSILLECTOMY     Reproductive/Obstetrics negative OB ROS                             Anesthesia Physical Anesthesia Plan  ASA: 2  Anesthesia Plan: General   Post-op Pain Management:    Induction: Intravenous  PONV Risk Score and Plan: Propofol infusion and TIVA  Airway Management Planned: Natural Airway and Nasal Cannula  Additional Equipment:   Intra-op Plan:   Post-operative Plan:   Informed Consent: I have reviewed the patients History and Physical, chart, labs and discussed the procedure including the risks, benefits and alternatives for the proposed anesthesia with the patient or authorized representative who has indicated his/her understanding  and acceptance.     Dental Advisory Given  Plan Discussed with: Anesthesiologist, CRNA and Surgeon  Anesthesia Plan Comments: (Patient consented for risks of anesthesia including but not limited to:  - adverse reactions to medications - risk of airway placement if required - damage to eyes, teeth, lips or other oral mucosa - nerve damage due to positioning  - sore throat or hoarseness - Damage to heart, brain, nerves, lungs, other parts of body or loss of life  Patient voiced understanding.)        Anesthesia Quick Evaluation

## 2021-04-27 NOTE — Interval H&P Note (Signed)
History and Physical Interval Note: Preprocedure H&P from 04/27/21  was reviewed and there was no interval change after seeing and examining the patient.  Written consent was obtained from the patient after discussion of risks, benefits, and alternatives. Patient has consented to proceed with Colonoscopy with possible intervention   04/27/2021 10:03 AM  Norma Lambert  has presented today for surgery, with the diagnosis of Family history of colon cancer Z80.0.  The various methods of treatment have been discussed with the patient and family. After consideration of risks, benefits and other options for treatment, the patient has consented to  Procedure(s): COLONOSCOPY WITH PROPOFOL (N/A) as a surgical intervention.  The patient's history has been reviewed, patient examined, no change in status, stable for surgery.  I have reviewed the patient's chart and labs.  Questions were answered to the patient's satisfaction.     Annamaria Helling

## 2021-04-28 ENCOUNTER — Encounter: Payer: Self-pay | Admitting: Gastroenterology

## 2021-04-28 LAB — SURGICAL PATHOLOGY

## 2021-05-12 ENCOUNTER — Other Ambulatory Visit: Payer: Self-pay | Admitting: Internal Medicine

## 2021-05-12 DIAGNOSIS — Z1231 Encounter for screening mammogram for malignant neoplasm of breast: Secondary | ICD-10-CM

## 2021-05-24 ENCOUNTER — Other Ambulatory Visit: Payer: Self-pay | Admitting: *Deleted

## 2021-05-24 DIAGNOSIS — D72820 Lymphocytosis (symptomatic): Secondary | ICD-10-CM

## 2021-05-25 ENCOUNTER — Other Ambulatory Visit
Admission: RE | Admit: 2021-05-25 | Discharge: 2021-05-25 | Disposition: A | Discharge status: Home / Self Care | Payer: Medicare PPO | Attending: Oncology | Admitting: Oncology

## 2021-05-25 ENCOUNTER — Other Ambulatory Visit: Payer: Medicare PPO

## 2021-05-25 ENCOUNTER — Ambulatory Visit: Payer: Medicare PPO | Admitting: Oncology

## 2021-05-25 ENCOUNTER — Other Ambulatory Visit: Payer: Self-pay

## 2021-05-25 DIAGNOSIS — D72820 Lymphocytosis (symptomatic): Secondary | ICD-10-CM | POA: Diagnosis present

## 2021-05-25 LAB — CBC WITH DIFFERENTIAL/PLATELET
Abs Immature Granulocytes: 0.02 10*3/uL (ref 0.00–0.07)
Basophils Absolute: 0.1 10*3/uL (ref 0.0–0.1)
Basophils Relative: 1 %
Eosinophils Absolute: 0.5 10*3/uL (ref 0.0–0.5)
Eosinophils Relative: 6 %
HCT: 40.3 % (ref 36.0–46.0)
Hemoglobin: 13.5 g/dL (ref 12.0–15.0)
Immature Granulocytes: 0 %
Lymphocytes Relative: 51 %
Lymphs Abs: 4.4 10*3/uL — ABNORMAL HIGH (ref 0.7–4.0)
MCH: 30.1 pg (ref 26.0–34.0)
MCHC: 33.5 g/dL (ref 30.0–36.0)
MCV: 90 fL (ref 80.0–100.0)
Monocytes Absolute: 0.7 10*3/uL (ref 0.1–1.0)
Monocytes Relative: 8 %
Neutro Abs: 2.9 10*3/uL (ref 1.7–7.7)
Neutrophils Relative %: 34 %
Platelets: 242 10*3/uL (ref 150–400)
RBC: 4.48 MIL/uL (ref 3.87–5.11)
RDW: 12.7 % (ref 11.5–15.5)
WBC: 8.6 10*3/uL (ref 4.0–10.5)
nRBC: 0 % (ref 0.0–0.2)

## 2021-05-25 LAB — BASIC METABOLIC PANEL
Anion gap: 10 (ref 5–15)
BUN: 23 mg/dL (ref 8–23)
CO2: 25 mmol/L (ref 22–32)
Calcium: 9.8 mg/dL (ref 8.9–10.3)
Chloride: 102 mmol/L (ref 98–111)
Creatinine, Ser: 1 mg/dL (ref 0.44–1.00)
GFR, Estimated: 59 mL/min — ABNORMAL LOW (ref >60)
Glucose, Bld: 106 mg/dL — ABNORMAL HIGH (ref 70–99)
Potassium: 4.5 mmol/L (ref 3.5–5.1)
Sodium: 137 mmol/L (ref 135–145)

## 2021-05-25 LAB — LACTATE DEHYDROGENASE: LDH: 125 U/L (ref 98–192)

## 2021-06-02 ENCOUNTER — Inpatient Hospital Stay: Payer: Medicare PPO | Attending: Oncology

## 2021-06-02 ENCOUNTER — Inpatient Hospital Stay: Payer: Medicare PPO | Admitting: Oncology

## 2021-06-05 ENCOUNTER — Ambulatory Visit: Payer: Medicare PPO

## 2021-06-07 ENCOUNTER — Ambulatory Visit
Admission: RE | Admit: 2021-06-07 | Discharge: 2021-06-07 | Disposition: A | Discharge status: Home / Self Care | Payer: Medicare PPO | Source: Ambulatory Visit | Attending: Internal Medicine | Admitting: Internal Medicine

## 2021-06-07 ENCOUNTER — Other Ambulatory Visit: Payer: Self-pay

## 2021-06-07 DIAGNOSIS — Z1231 Encounter for screening mammogram for malignant neoplasm of breast: Secondary | ICD-10-CM

## 2022-12-12 ENCOUNTER — Other Ambulatory Visit: Payer: Self-pay | Admitting: Internal Medicine

## 2022-12-12 DIAGNOSIS — Z1231 Encounter for screening mammogram for malignant neoplasm of breast: Secondary | ICD-10-CM

## 2022-12-19 ENCOUNTER — Ambulatory Visit
Admission: RE | Admit: 2022-12-19 | Discharge: 2022-12-19 | Disposition: A | Discharge status: Home / Self Care | Payer: Medicare PPO | Source: Ambulatory Visit | Attending: Internal Medicine | Admitting: Internal Medicine

## 2022-12-19 DIAGNOSIS — Z1231 Encounter for screening mammogram for malignant neoplasm of breast: Secondary | ICD-10-CM | POA: Diagnosis present

## 2023-11-10 IMAGING — MG MM DIGITAL SCREENING BILAT W/ TOMO AND CAD
8 series · 8 of 24 positions shown · non-contrast
Comparison: Previous exam(s).

CLINICAL DATA: Screening.

EXAM:
DIGITAL SCREENING BILATERAL MAMMOGRAM WITH TOMOSYNTHESIS AND CAD
TECHNIQUE: Bilateral screening digital craniocaudal and mediolateral oblique
mammograms were obtained. Bilateral screening digital breast
tomosynthesis was performed. The images were evaluated with
computer-aided detection.

[L CC synth-2D]
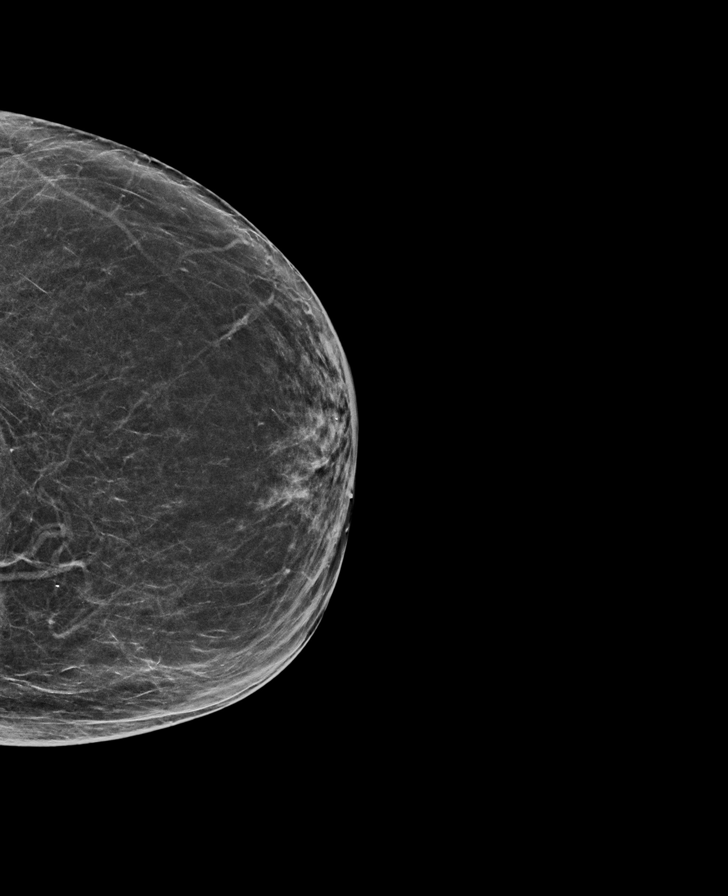

[L MLO synth-2D]
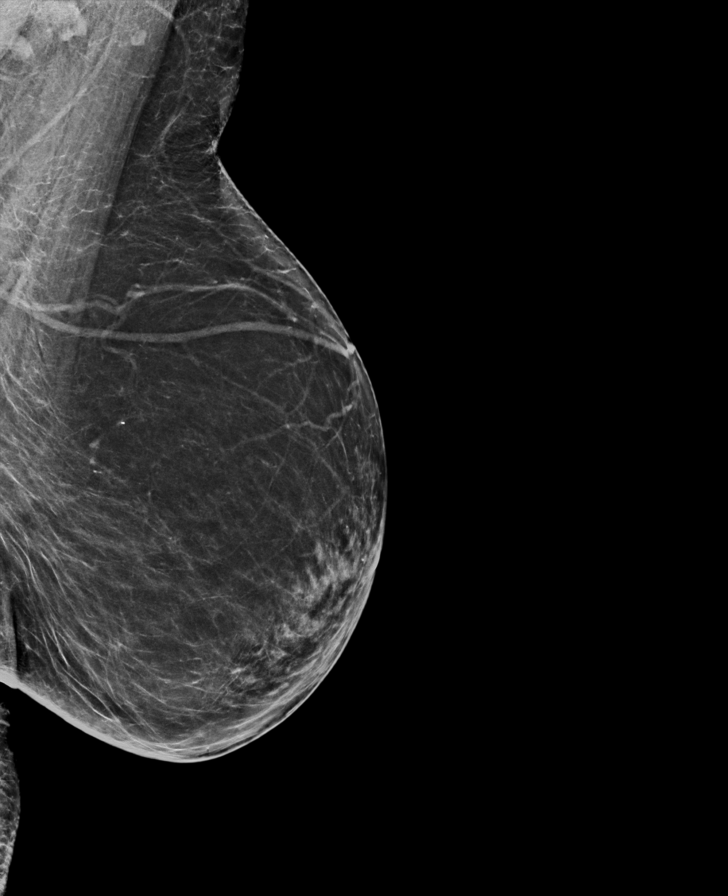

[R MLO synth-2D]
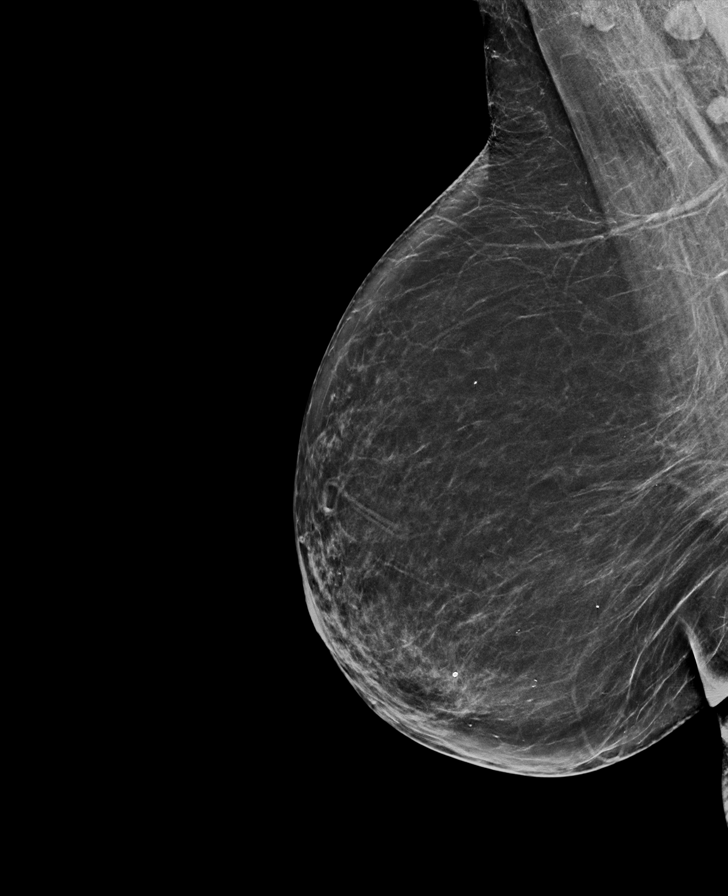

[R CC synth-2D]
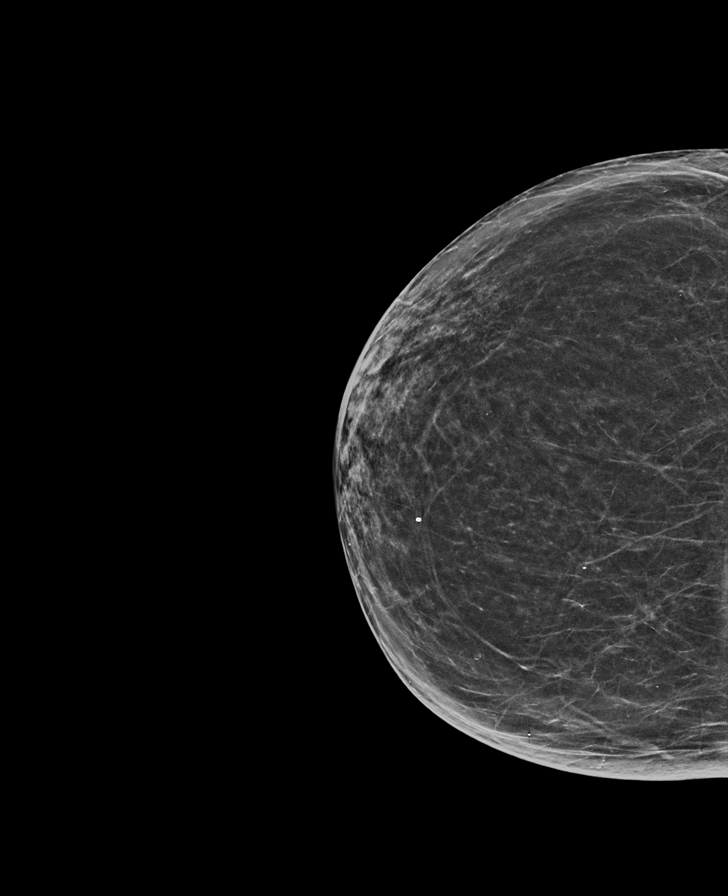

[L CC tomo · tomo slice 33/65.0]
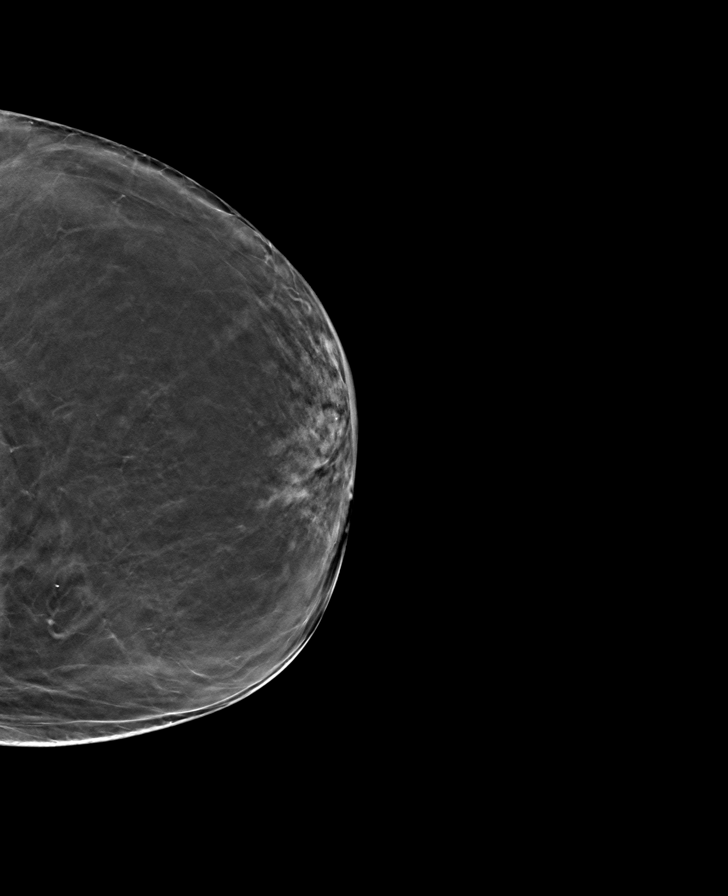

[L MLO tomo · tomo slice 37/73.0]
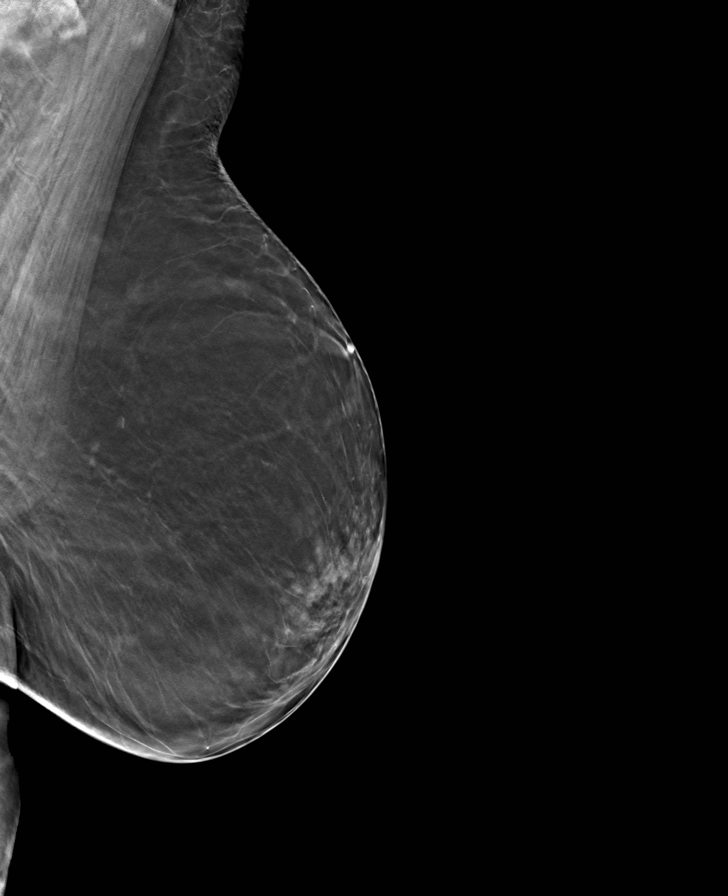

[R CC tomo · tomo slice 32/63.0]
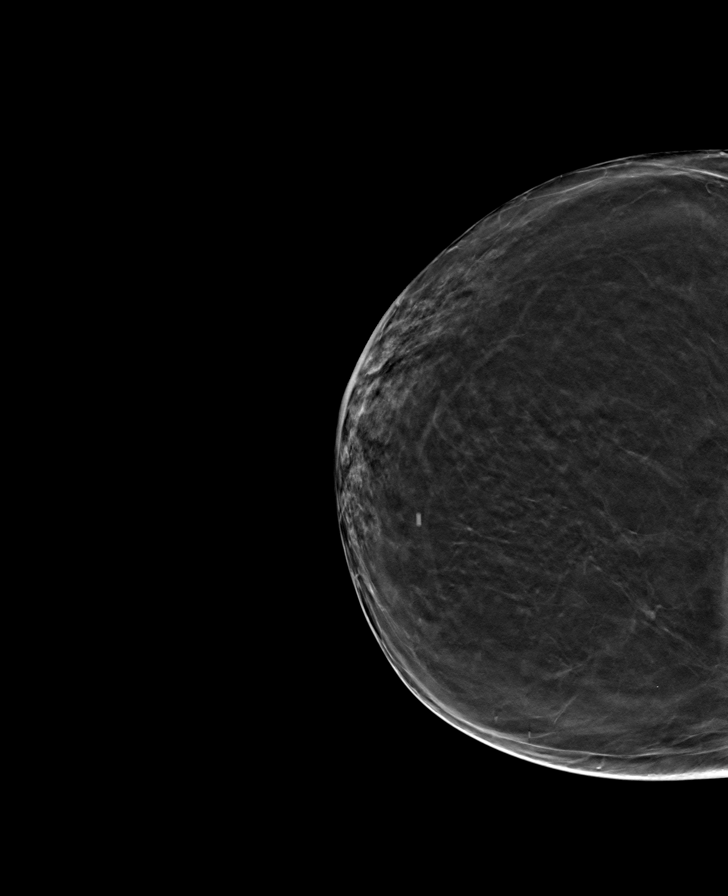

[R MLO tomo · tomo slice 37/73.0]
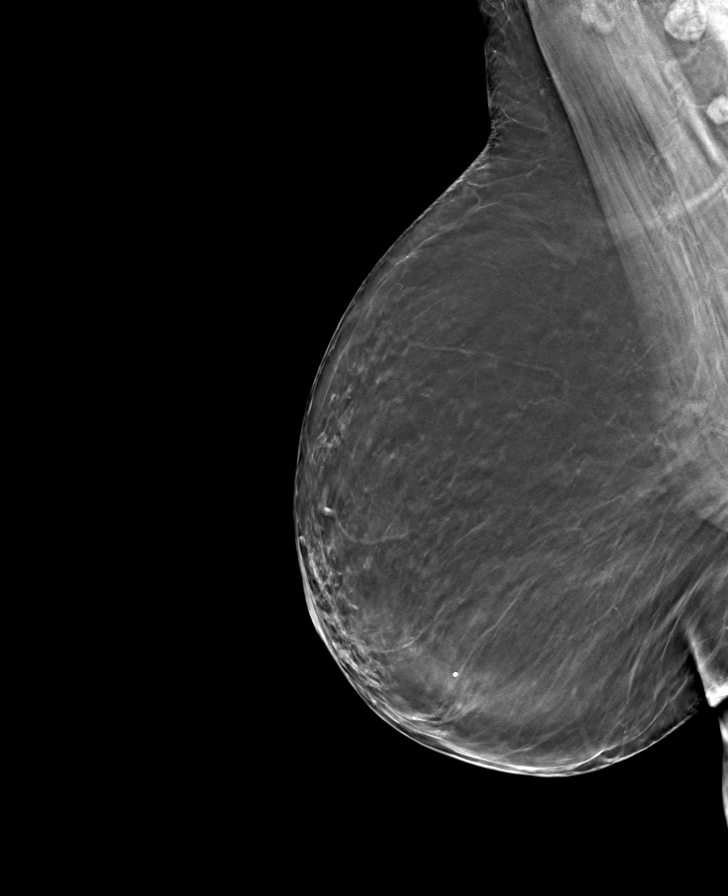

[8 of 24 positions shown; findings below may reference images not displayed]

ACR Breast Density Category b: There are scattered areas of
fibroglandular density.
FINDINGS: There are no findings suspicious for malignancy.
IMPRESSION: No mammographic evidence of malignancy. A result letter of this
screening mammogram will be mailed directly to the patient.

RECOMMENDATION:
Screening mammogram in one year. (Code:51-O-LD2)

BI-RADS CATEGORY  1: Negative.
